# Patient Record
Sex: Male | Born: 1975 | Hispanic: No | Marital: Single | Smoking: Current every day smoker
Health system: Southern US, Community
[De-identification: ages and names within clinical notes are randomized; demographics above are authoritative.]

## PROBLEM LIST (undated history)

## (undated) DIAGNOSIS — D569 Thalassemia, unspecified: Secondary | ICD-10-CM

## (undated) DIAGNOSIS — D649 Anemia, unspecified: Secondary | ICD-10-CM

## (undated) HISTORY — PX: HERNIA REPAIR: SHX51

## (undated) HISTORY — DX: Anemia, unspecified: D64.9

## (undated) HISTORY — DX: Thalassemia, unspecified: D56.9

## (undated) HISTORY — PX: APPENDECTOMY: SHX54

---

## 2012-11-21 ENCOUNTER — Emergency Department (HOSPITAL_COMMUNITY)
Admission: EM | Admit: 2012-11-21 | Discharge: 2012-11-21 | Disposition: A | Discharge status: Home / Self Care | Payer: Managed Care, Other (non HMO) | Attending: Emergency Medicine | Admitting: Emergency Medicine

## 2012-11-21 ENCOUNTER — Encounter (HOSPITAL_COMMUNITY): Payer: Self-pay | Admitting: *Deleted

## 2012-11-21 DIAGNOSIS — M79604 Pain in right leg: Secondary | ICD-10-CM

## 2012-11-21 DIAGNOSIS — F17200 Nicotine dependence, unspecified, uncomplicated: Secondary | ICD-10-CM | POA: Insufficient documentation

## 2012-11-21 DIAGNOSIS — M79609 Pain in unspecified limb: Secondary | ICD-10-CM | POA: Insufficient documentation

## 2012-11-21 LAB — CBC WITH DIFFERENTIAL/PLATELET
Basophils Relative: 0 % (ref 0–1)
Eosinophils Absolute: 0.3 10*3/uL (ref 0.0–0.7)
Eosinophils Relative: 2 % (ref 0–5)
MCH: 19.5 pg — ABNORMAL LOW (ref 26.0–34.0)
MCHC: 33 g/dL (ref 30.0–36.0)
MCV: 59 fL — ABNORMAL LOW (ref 78.0–100.0)
Monocytes Relative: 7 % (ref 3–12)
Neutrophils Relative %: 58 % (ref 43–77)
Platelets: 222 10*3/uL (ref 150–400)

## 2012-11-21 LAB — COMPREHENSIVE METABOLIC PANEL WITH GFR
ALT: 20 U/L (ref 0–53)
AST: 17 U/L (ref 0–37)
Albumin: 4.4 g/dL (ref 3.5–5.2)
Alkaline Phosphatase: 100 U/L (ref 39–117)
BUN: 13 mg/dL (ref 6–23)
CO2: 29 meq/L (ref 19–32)
Calcium: 9.9 mg/dL (ref 8.4–10.5)
Chloride: 101 meq/L (ref 96–112)
Creatinine, Ser: 0.83 mg/dL (ref 0.50–1.35)
GFR calc Af Amer: >90 mL/min
GFR calc non Af Amer: >90 mL/min
Glucose, Bld: 105 mg/dL — ABNORMAL HIGH (ref 70–99)
Potassium: 4 meq/L (ref 3.5–5.1)
Sodium: 140 meq/L (ref 135–145)
Total Bilirubin: 0.9 mg/dL (ref 0.3–1.2)
Total Protein: 7.8 g/dL (ref 6.0–8.3)

## 2012-11-21 LAB — CK: Total CK: 93 U/L (ref 7–232)

## 2012-11-21 NOTE — ED Provider Notes (Signed)
History     CSN: [id number]  Arrival date & time 11/21/12  2037   First MD Initiated Contact with Patient 11/21/12 2148      Chief Complaint  Patient presents with  . Leg Pain    bilateral    (Consider location/radiation/quality/duration/timing/severity/associated sxs/prior treatment) HPI Comments: The patient presents here with bilateral lower extremity pain that started while driving here from [facility].  He is now having the same sensation in his arms.  He denies any chest pain or shortness of breath.    Patient is a 36 y.o. male presenting with leg pain. The history is provided by the patient.  Leg Pain  The incident occurred 2 days ago. The incident occurred at home. There was no injury mechanism.    History reviewed. No pertinent past medical history.  Past Surgical History  Procedure Date  . Appendectomy   . Hernia repair     bilateral inguinal    History reviewed. No pertinent family history.  History  Substance Use Topics  . Smoking status: Current Every Day Smoker  . Smokeless tobacco: Not on file  . Alcohol Use: Yes      Review of Systems  All other systems reviewed and are negative.    Allergies  Review of patient's allergies indicates no known allergies.  Home Medications   Current Outpatient Rx  Name  Route  Sig  Dispense  Refill  . OVER THE COUNTER MEDICATION   Oral   Take 2 tablets by mouth daily as needed. Panadol extra.  Pain and fever reducer.           BP 164/88  Pulse 82  Temp 97.9 F (36.6 C)  Resp 16  SpO2 100%  Physical Exam  Nursing note and vitals reviewed. Constitutional: He is oriented to person, place, and time. He appears well-developed and well-nourished. No distress.  HENT:  Head: Normocephalic and atraumatic.  Mouth/Throat: Oropharynx is clear and moist.  Neck: Normal range of motion. Neck supple.  Cardiovascular: Normal rate and regular rhythm.   No murmur heard. Pulmonary/Chest: Effort normal and breath  sounds normal. No respiratory distress.  Abdominal: Soft. Bowel sounds are normal.  Musculoskeletal: Normal range of motion. He exhibits no edema.       There is no calf tenderness or swelling.  The distal pulses are easily palpable.  Homan sign is absent bilaterally.  There are no palpable cords.    Neurological: He is alert and oriented to person, place, and time.  Skin: Skin is warm and dry. He is not diaphoretic.    ED Course  Procedures (including critical care time)  Labs Reviewed  CBC WITH DIFFERENTIAL - Abnormal; Notable for the following:    WBC 13.2 (*)     RBC 6.22 (*)     Hemoglobin 12.1 (*)     HCT 36.7 (*)     MCV 59.0 (*)     MCH 19.5 (*)     Lymphs Abs 4.3 (*)     All other components within normal limits  COMPREHENSIVE METABOLIC PANEL - Abnormal; Notable for the following:    Glucose, Bld 105 (*)     All other components within normal limits  CK   No results found.   No diagnosis found.    MDM  Very low suspicion for dvt as the symptoms are bilateral, Homan is absent, and the symptoms are in his arms as well.  Other consideration is rhabdo, however ck is normal.  At this point, he will be discharged to home, to return prn.        Geoffery Lyons, MD 11/21/12 2245

## 2012-11-21 NOTE — ED Notes (Signed)
Pt state that it took 6 days but he drove here from [facility]. Pt states that three days ago he started having pain and numbness in both of his legs. Pt states that the pain is now in his knees and the numbness is also in his hands. Pt ambulatory

## 2013-01-12 ENCOUNTER — Encounter (HOSPITAL_COMMUNITY): Payer: Self-pay | Admitting: Emergency Medicine

## 2013-01-12 ENCOUNTER — Emergency Department (HOSPITAL_COMMUNITY)
Admission: EM | Admit: 2013-01-12 | Discharge: 2013-01-12 | Disposition: A | Discharge status: Home / Self Care | Payer: Managed Care, Other (non HMO) | Attending: Emergency Medicine | Admitting: Emergency Medicine

## 2013-01-12 DIAGNOSIS — Z862 Personal history of diseases of the blood and blood-forming organs and certain disorders involving the immune mechanism: Secondary | ICD-10-CM | POA: Insufficient documentation

## 2013-01-12 DIAGNOSIS — M545 Low back pain, unspecified: Secondary | ICD-10-CM | POA: Insufficient documentation

## 2013-01-12 DIAGNOSIS — R5383 Other fatigue: Secondary | ICD-10-CM | POA: Insufficient documentation

## 2013-01-12 DIAGNOSIS — M79609 Pain in unspecified limb: Secondary | ICD-10-CM | POA: Insufficient documentation

## 2013-01-12 DIAGNOSIS — R5381 Other malaise: Secondary | ICD-10-CM | POA: Insufficient documentation

## 2013-01-12 DIAGNOSIS — F17200 Nicotine dependence, unspecified, uncomplicated: Secondary | ICD-10-CM | POA: Insufficient documentation

## 2013-01-12 DIAGNOSIS — R61 Generalized hyperhidrosis: Secondary | ICD-10-CM | POA: Insufficient documentation

## 2013-01-12 DIAGNOSIS — R509 Fever, unspecified: Secondary | ICD-10-CM | POA: Insufficient documentation

## 2013-01-12 LAB — URINE MICROSCOPIC-ADD ON

## 2013-01-12 LAB — URINALYSIS, ROUTINE W REFLEX MICROSCOPIC
Bilirubin Urine: NEGATIVE
Nitrite: POSITIVE — AB
Specific Gravity, Urine: 1.013 (ref 1.005–1.030)
Urobilinogen, UA: 1 mg/dL (ref 0.0–1.0)
pH: 7 (ref 5.0–8.0)

## 2013-01-12 MED ORDER — IBUPROFEN 600 MG PO TABS: 600.0000 mg | ORAL_TABLET | Freq: Four times a day (QID) | ORAL | Status: DC | PRN

## 2013-01-12 MED ORDER — METHOCARBAMOL 500 MG PO TABS: 1000.0000 mg | ORAL_TABLET | Freq: Four times a day (QID) | ORAL | Status: DC

## 2013-01-12 MED ORDER — TRAMADOL HCL 50 MG PO TABS: 50.0000 mg | ORAL_TABLET | Freq: Four times a day (QID) | ORAL | Status: DC | PRN

## 2013-01-12 NOTE — ED Provider Notes (Signed)
History    This chart was scribed for non-physician practitioner working with [name], MD by Frederik Pear, ED Scribe. This patient was seen in room TR05C/TR05C and the patient's care was started at 1503.   CSN: [id number]  Arrival date & time 01/12/13  1438   First MD Initiated Contact with Patient 01/12/13 1503      Chief Complaint  Patient presents with  . Back Pain  . Leg Pain    (Consider location/radiation/quality/duration/timing/severity/associated sxs/prior treatment) Patient is a 37 y.o. male presenting with back pain and leg pain. The history is provided by the patient. No language interpreter was used.  Back Pain Associated symptoms: fever, leg pain and weakness   Associated symptoms: no dysuria   Leg Pain Associated symptoms: back pain and fever     Jonathan Bautista is a 37 y.o. male who presents to the Emergency Department complaining of gradual onset, constant, gradually worsening lower left-sided back pain that radiates as tingling and burning pain down his buttocks to his knees that is aggravated by walking with associated nocturnal hidrosis and generalized weakness that began 3 days ago. He states that the pain kept him awake during the first night as well as a subjective fever and polyuria that began on the first day, but have all since resolved. He denies any trauma or injury to the area, dysuria, or urinary or bowel incontinence. He reports a h/o of similar pain after he drove cross country from [place], [place], but states that the current pain is much stronger. He reports that he has been treating the pain with ibuprofen, which has provided relief, and OTC azo tablets, which were recommended by the pharmacist at [facility].  He reports a h/o of chronic anemia, but no h/o of previous back surgeries. He denies any use of IV drugs.      History reviewed. No pertinent past medical history.  Past Surgical History  Procedure Laterality Date  . Appendectomy    . Hernia  repair      bilateral inguinal    No family history on file.  History  Substance Use Topics  . Smoking status: Current Every Day Smoker  . Smokeless tobacco: Not on file  . Alcohol Use: No      Review of Systems  Constitutional: Positive for fever.  Endocrine: Positive for polyuria.  Genitourinary: Negative for dysuria.  Musculoskeletal: Positive for back pain.  Neurological: Positive for weakness.  All other systems reviewed and are negative.    Allergies  Review of patient's allergies indicates no known allergies.  Home Medications   Current Outpatient Rx  Name  Route  Sig  Dispense  Refill  . OVER THE COUNTER MEDICATION   Oral   Take 2 tablets by mouth daily as needed. Panadol extra.  Pain and fever reducer.           BP 136/86  Pulse 80  Temp(Src) 98.2 F (36.8 C) (Oral)  SpO2 100%  Physical Exam  Nursing note and vitals reviewed. Constitutional: He is oriented to person, place, and time. He appears well-developed and well-nourished.  HENT:  Head: Normocephalic and atraumatic.  Eyes: Conjunctivae are normal.  Neck: Normal range of motion. Neck supple.  Pulmonary/Chest: Effort normal. No respiratory distress.  Abdominal: Soft. There is no tenderness. There is no CVA tenderness.  Musculoskeletal: Normal range of motion. He exhibits tenderness.       Lumbar back: He exhibits tenderness and spasm.       Back:  Tender over the paraspinal muscles in the lumbar spine. Normal sensation and reflexes in all extremites Normal gait.   Neurological: He is alert and oriented to person, place, and time. He has normal strength and normal reflexes. No sensory deficit. He exhibits normal muscle tone. Coordination and gait normal.  5/5 strength in all extremities. 2+ patellar tendon DTRs bilaterally.   Skin: Skin is warm and dry. No erythema.  Psychiatric: He has a normal mood and affect. Thought content normal.    ED Course  Procedures (including critical care  time)  DIAGNOSTIC STUDIES: Oxygen Saturation is 100% on room air, normal by my interpretation.    COORDINATION OF CARE:  15:19- Discussed planned course of treatment with the patient, including a UA, who is agreeable at this time.  Results for orders placed during the hospital encounter of 01/12/13  URINALYSIS, ROUTINE W REFLEX MICROSCOPIC      Result Value Range   Color, Urine AMBER (*) YELLOW   APPearance CLOUDY (*) CLEAR   Specific Gravity, Urine 1.013  1.005 - 1.030   pH 7.0  5.0 - 8.0   Glucose, UA NEGATIVE  NEGATIVE mg/dL   Hgb urine dipstick TRACE (*) NEGATIVE   Bilirubin Urine NEGATIVE  NEGATIVE   Ketones, ur NEGATIVE  NEGATIVE mg/dL   Protein, ur NEGATIVE  NEGATIVE mg/dL   Urobilinogen, UA 1.0  0.0 - 1.0 mg/dL   Nitrite POSITIVE (*) NEGATIVE   Leukocytes, UA TRACE (*) NEGATIVE  URINE MICROSCOPIC-ADD ON      Result Value Range   Squamous Epithelial / LPF RARE  RARE   WBC, UA 0-2  <3 WBC/hpf   RBC / HPF 3-6  <3 RBC/hpf   Bacteria, UA RARE  RARE      Labs Reviewed  URINALYSIS, ROUTINE W REFLEX MICROSCOPIC - Abnormal; Notable for the following:    Color, Urine AMBER (*)    APPearance CLOUDY (*)    Hgb urine dipstick TRACE (*)    Nitrite POSITIVE (*)    Leukocytes, UA TRACE (*)    All other components within normal limits  URINE MICROSCOPIC-ADD ON   No results found.   1. Lower back pain       3:37 PM Patient seen and examined. Work-up initiated.    Vital signs reviewed and are as follows: Filed Vitals:   01/12/13 1453  BP: 136/86  Pulse: 80  Temp: 98.2 F (36.8 C)   UA shows positive nitrate, but 0-2 WBC and rare bacteria. Urine culture is sent.   No red flag s/s of low back pain. Patient was counseled on back pain precautions and told to do activity as tolerated but do not lift, push, or pull heavy objects more than 10 pounds for the next week.  Patient counseled to use ice or heat on back for no longer than 15 minutes every hour.   Patient  prescribed muscle relaxer and counseled on proper use of muscle relaxant medication.    Patient prescribed narcotic pain medicine and counseled on proper use of narcotic pain medications. Counseled not to combine this medication with others containing tylenol.   Urged patient not to drink alcohol, drive, or perform any other activities that requires focus while taking either of these medications.  Patient urged to follow-up with PCP if pain does not improve with treatment and rest or if pain becomes recurrent. Urged to return with worsening severe pain, loss of bowel or bladder control, trouble walking.   The patient verbalizes understanding and  agrees with the plan.    MDM  Patient with back pain with radiation into bilateral legs. No neurological deficits. Patient is ambulatory, normal gait. No warning symptoms of back pain including: loss of bowel or bladder control, saddle paresthesias, night sweats, waking from sleep with back pain, unexplained fevers or weight loss, h/o cancer, IVDU, recent trauma. No concern for cauda equina, epidural abscess, or other serious cause of back pain. Conservative measures such as rest, ice/heat and pain medicine indicated with PCP follow-up if no improvement with conservative management.    I personally performed the services described in this documentation, which was scribed in my presence. The recorded information has been reviewed and is accurate.        Renne Crigler, [place] 01/12/13 [phone]

## 2013-01-12 NOTE — ED Notes (Signed)
Onset 3 days ago pain left flank and frequent urination 3 times at night and developed bilateral lower extremity pain tingling numbness. States took motrin with relief.  Concerned pain not resolving.  Pain currently 10/10.

## 2013-01-15 NOTE — ED Provider Notes (Signed)
Medical screening examination/treatment/procedure(s) were performed by non-physician practitioner and as supervising physician I was immediately available for consultation/collaboration.   [name], MD 01/15/13 [phone]

## 2013-01-25 ENCOUNTER — Ambulatory Visit (INDEPENDENT_AMBULATORY_CARE_PROVIDER_SITE_OTHER): Payer: Managed Care, Other (non HMO) | Admitting: Family Medicine

## 2013-01-25 ENCOUNTER — Ambulatory Visit: Payer: Managed Care, Other (non HMO)

## 2013-01-25 VITALS — BP 164/97 | HR 90 | Temp 97.8°F | Resp 18 | Ht 71.0 in | Wt 205.0 lb

## 2013-01-25 DIAGNOSIS — D649 Anemia, unspecified: Secondary | ICD-10-CM

## 2013-01-25 DIAGNOSIS — R202 Paresthesia of skin: Secondary | ICD-10-CM

## 2013-01-25 DIAGNOSIS — R531 Weakness: Secondary | ICD-10-CM

## 2013-01-25 DIAGNOSIS — M25562 Pain in left knee: Secondary | ICD-10-CM

## 2013-01-25 DIAGNOSIS — M25569 Pain in unspecified knee: Secondary | ICD-10-CM

## 2013-01-25 DIAGNOSIS — R5381 Other malaise: Secondary | ICD-10-CM

## 2013-01-25 DIAGNOSIS — N390 Urinary tract infection, site not specified: Secondary | ICD-10-CM

## 2013-01-25 DIAGNOSIS — Z131 Encounter for screening for diabetes mellitus: Secondary | ICD-10-CM

## 2013-01-25 DIAGNOSIS — M25561 Pain in right knee: Secondary | ICD-10-CM

## 2013-01-25 DIAGNOSIS — R55 Syncope and collapse: Secondary | ICD-10-CM

## 2013-01-25 LAB — POCT CBC
Granulocyte percent: 65.4 %G (ref 37–80)
HCT, POC: 38.4 % — AB (ref 43.5–53.7)
Hemoglobin: 12.1 g/dL — AB (ref 14.1–18.1)
Lymph, poc: 3.8 — AB (ref 0.6–3.4)
MCH, POC: 19.4 pg — AB (ref 27–31.2)
MCHC: 31.5 g/dL — AB (ref 31.8–35.4)
MCV: 61.5 fL — AB (ref 80–97)
MID (cbc): 0.6 (ref 0–0.9)
POC Granulocyte: 8.4 — AB (ref 2–6.9)
POC LYMPH PERCENT: 29.8 % (ref 10–50)
POC MID %: 4.8 %M (ref 0–12)
Platelet Count, POC: 300 10*3/uL (ref 142–424)
RBC: 6.25 M/uL — AB (ref 4.69–6.13)
RDW, POC: 16.6 %
WBC: 12.8 10*3/uL — AB (ref 4.6–10.2)

## 2013-01-25 LAB — POCT URINALYSIS DIPSTICK
Bilirubin, UA: NEGATIVE
Glucose, UA: NEGATIVE
Ketones, UA: NEGATIVE
Leukocytes, UA: NEGATIVE
Nitrite, UA: NEGATIVE
Protein, UA: NEGATIVE
Spec Grav, UA: 1.015
Urobilinogen, UA: 0.2
pH, UA: 7

## 2013-01-25 LAB — RPR

## 2013-01-25 LAB — POCT SEDIMENTATION RATE: POCT SED RATE: 8 mm/hr (ref 0–22)

## 2013-01-25 LAB — COMPREHENSIVE METABOLIC PANEL
ALT: 22 U/L (ref 0–53)
Albumin: 5 g/dL (ref 3.5–5.2)
CO2: 28 mEq/L (ref 19–32)
Glucose, Bld: 83 mg/dL (ref 70–99)
Potassium: 3.7 mEq/L (ref 3.5–5.3)
Sodium: 139 mEq/L (ref 135–145)
Total Bilirubin: 1.1 mg/dL (ref 0.3–1.2)
Total Protein: 8.1 g/dL (ref 6.0–8.3)

## 2013-01-25 LAB — POCT UA - MICROSCOPIC ONLY
Bacteria, U Microscopic: NEGATIVE
Casts, Ur, LPF, POC: NEGATIVE
Crystals, Ur, HPF, POC: NEGATIVE
Mucus, UA: NEGATIVE
WBC, Ur, HPF, POC: NEGATIVE
Yeast, UA: NEGATIVE

## 2013-01-25 LAB — COMPREHENSIVE METABOLIC PANEL WITH GFR
AST: 16 U/L (ref 0–37)
Alkaline Phosphatase: 106 U/L (ref 39–117)
BUN: 9 mg/dL (ref 6–23)
Calcium: 9.7 mg/dL (ref 8.4–10.5)
Chloride: 100 meq/L (ref 96–112)
Creat: 0.81 mg/dL (ref 0.50–1.35)

## 2013-01-25 LAB — URIC ACID: Uric Acid, Serum: 5.3 mg/dL (ref 4.0–7.8)

## 2013-01-25 LAB — POCT GLYCOSYLATED HEMOGLOBIN (HGB A1C): Hemoglobin A1C: 5

## 2013-01-25 MED ORDER — CIPROFLOXACIN HCL 250 MG PO TABS: 250.0000 mg | ORAL_TABLET | Freq: Two times a day (BID) | ORAL | Status: DC

## 2013-01-25 NOTE — Progress Notes (Signed)
[facility]:  Office Visit  Chief Complaint:  Chief Complaint  Patient presents with  . Generalized Body Aches    joints ache- began today- wants Uric Acid level checked  . Night Sweats  . Leg Pain    Bil leg numbness/prickly feeling- began about 5 days ago  . Abdominal Pain    LUQ pain  . burping  . Dizziness    HPI: Jonathan Bautista is a 37 y.o. male who complains of  Multiple issues:   5 day h/o Dizziness and weakness, has pain only when he sits or lays in his bed. Not when he walks., Radiates from his knees to his  During the weekend he got drunk and threw up the next day. He has been eating and drinking ok since but has loss if appetite.  Numbness and tingling all the way from bilateral knees to toes.  Started 1 month ago from [place]. Went to [facility] ER and got evaluated.  Was seen by rheumatologist and did not get any tests done Was seen in ER with similar sxs again on 01/12/2013 but was told he needed to get a PCP Goes to bed at 12 or 11 , sleeps 9-10 hours. Feels that is too much sleep for him  No recent travels outside of country except for when he came here 1 year ago from [place], went to [facility] then came here 1 month ago  Took Ibuprofen Has not had the flu vaccine No prior knee injuries No prior STDs, recently tested in [facility] and was neg.  Denies weight loss/night sweats, fevers, chills, LAD  Past Medical History  Diagnosis Date  . Anemia   . Thalassemia    Past Surgical History  Procedure Laterality Date  . Appendectomy    . Hernia repair      bilateral inguinal   History   Social History  . Marital Status: Single    Spouse Name: N/A    Number of Children: N/A  . Years of Education: N/A   Social History Main Topics  . Smoking status: Current Every Day Smoker    Types: Cigarettes  . Smokeless tobacco: None  . Alcohol Use: Yes     Comment: "sometimes"  . Drug Use: No  . Sexually Active: None   Other Topics Concern  .  None   Social History Narrative  . None   Family History  Problem Relation Age of Onset  . Diabetes Mother   . Hyperlipidemia Mother   . Hypertension Mother   . Hypertension Father    No Known Allergies Prior to Admission medications   Medication Sig Start Date End Date Taking? Authorizing Provider  ibuprofen (ADVIL,MOTRIN) 600 MG tablet Take 1 tablet (600 mg total) by mouth every 6 (six) hours as needed for pain. 01/12/13   Renne Crigler, PA  methocarbamol (ROBAXIN) 500 MG tablet Take 2 tablets (1,000 mg total) by mouth 4 (four) times daily. 01/12/13   Renne Crigler, PA  phenazopyridine (PYRIDIUM) 100 MG tablet Take 100-200 mg by mouth 2 (two) times daily as needed for pain.    Historical Provider, MD  traMADol (ULTRAM) 50 MG tablet Take 1 tablet (50 mg total) by mouth every 6 (six) hours as needed for pain. 01/12/13   Renne Crigler, PA     ROS: The patient denies fevers, chills, night sweats, unintentional weight loss, chest pain, palpitations, wheezing, dyspnea on exertion, nausea, vomiting, abdominal pain, dysuria, hematuria, melena.  All other  systems have been reviewed and were otherwise negative with the exception of those mentioned in the HPI and as above.    PHYSICAL EXAM: Filed Vitals:   01/25/13 1423  BP: 164/97  Pulse: 90  Temp: 97.8 F (36.6 C)  Resp: 18   Filed Vitals:   01/25/13 1423  Height: 5\' 11"  (1.803 m)  Weight: 205 lb (92.987 kg)   Body mass index is 28.6 kg/(m^2).  General: Alert, no acute distress HEENT:  Normocephalic, atraumatic, oropharynx patent. EOMI, PERRLA, fundoscopic exam nl Cardiovascular:  Regular rate and rhythm, no rubs murmurs or gallops.  No Carotid bruits, radial pulse intact. No pedal edema.  Respiratory: Clear to auscultation bilaterally.  No wheezes, rales, or rhonchi.  No cyanosis, no use of accessory musculature GI: No organomegaly, abdomen is soft and non-tender, positive bowel sounds.  No masses. Skin: No rashes. Neurologic:  Facial musculature symmetric. Psychiatric: Patient is appropriate throughout our interaction. Lymphatic: No cervical lymphadenopathy Musculoskeletal: Gait intact. Knee bilateral-nl exam Legs-no assymm, nontender, neg Homans, microfilament exam nl  LABS: Results for orders placed in visit on 01/25/13  COMPREHENSIVE METABOLIC PANEL      Result Value Range   Sodium 139  135 - 145 mEq/L   Potassium 3.7  3.5 - 5.3 mEq/L   Chloride 100  96 - 112 mEq/L   CO2 28  19 - 32 mEq/L   Glucose, Bld 83  70 - 99 mg/dL   BUN 9  6 - 23 mg/dL   Creat 0.98  1.19 - 1.47 mg/dL   Total Bilirubin 1.1  0.3 - 1.2 mg/dL   Alkaline Phosphatase 106  39 - 117 U/L   AST 16  0 - 37 U/L   ALT 22  0 - 53 U/L   Total Protein 8.1  6.0 - 8.3 g/dL   Albumin 5.0  3.5 - 5.2 g/dL   Calcium 9.7  8.4 - 82.9 mg/dL  URIC ACID      Result Value Range   Uric Acid, Serum 5.3  4.0 - 7.8 mg/dL  RPR      Result Value Range   RPR NON REAC  NON REAC  POCT CBC      Result Value Range   WBC 12.8 (*) 4.6 - 10.2 K/uL   Lymph, poc 3.8 (*) 0.6 - 3.4   POC LYMPH PERCENT 29.8  10 - 50 %L   MID (cbc) 0.6  0 - 0.9   POC MID % 4.8  0 - 12 %M   POC Granulocyte 8.4 (*) 2 - 6.9   Granulocyte percent 65.4  37 - 80 %G   RBC 6.25 (*) 4.69 - 6.13 M/uL   Hemoglobin 12.1 (*) 14.1 - 18.1 g/dL   HCT, POC 56.2 (*) 13.0 - 53.7 %   MCV 61.5 (*) 80 - 97 fL   MCH, POC 19.4 (*) 27 - 31.2 pg   MCHC 31.5 (*) 31.8 - 35.4 g/dL   RDW, POC 86.5     Platelet Count, POC 300  142 - 424 K/uL   MPV    0 - 99.8 fL  POCT SEDIMENTATION RATE      Result Value Range   POCT SED RATE 8  0 - 22 mm/hr  POCT GLYCOSYLATED HEMOGLOBIN (HGB A1C)      Result Value Range   Hemoglobin A1C 5.0    POCT UA - MICROSCOPIC ONLY      Result Value Range   WBC, Ur, HPF, POC neg  RBC, urine, microscopic 4-6     Bacteria, U Microscopic neg     Mucus, UA neg     Epithelial cells, urine per micros 0-2     Crystals, Ur, HPF, POC neg     Casts, Ur, LPF, POC neg     Yeast,  UA neg    POCT URINALYSIS DIPSTICK      Result Value Range   Color, UA yellow     Clarity, UA clear     Glucose, UA neg     Bilirubin, UA neg     Ketones, UA neg     Spec Grav, UA 1.015     Blood, UA moderate     pH, UA 7.0     Protein, UA neg     Urobilinogen, UA 0.2     Nitrite, UA neg     Leukocytes, UA Negative       EKG/XRAY:   Primary read interpreted by Dr. Conley Rolls at [facility]. No fractures or dislocation  Bilateral knee No gouty changes, no DJD    ASSESSMENT/PLAN: Encounter Diagnoses  Name Primary?  . Knee pain, bilateral Yes  . Weakness generalized   . Screening for diabetes mellitus   . Anemia   . Paresthesia   . UTI (urinary tract infection)    37 y/o [name] male, who came to the [place]  1year ago. Was living in [place] but 1 month ago came to the [place] to study [place] at [facility]. He is very adament about dictating his care.  Patient presents with multiple medical problems as above. I will get labs for numbness/tingling of bilateral legs from knee down. Does not appear to be high risk for DVT. Only risk factor was long car ride from [facility].  I reviewed his chart from his 2 ER visits. There was a UA that was +, will treat and get urine cx.  Rx Cipro 250 mg BID Patient was given urine cup to collect specimen Labs pending for RPR, ESR, uric acid and Hgb electrophoresis.  F/u prn   If he returns and we are able to get a clean and dirty UA for GC then please collect it and  order a uriprobe for me. He stated to the assistant after the fact that he had some white lesions on his penis, nonpainful after I had left the room but was too embarrassed to tell me.  He denied having any STDs to me when I asked, he stated he had his STD tests done in [facility] and all was negative.     Yuleidy Rappleye PHUONG, DO 01/26/2013 11:45 AM

## 2013-01-26 ENCOUNTER — Telehealth: Payer: Self-pay | Admitting: *Deleted

## 2013-01-27 LAB — URINE CULTURE
Colony Count: NO GROWTH
Organism ID, Bacteria: NO GROWTH

## 2013-01-29 ENCOUNTER — Other Ambulatory Visit (INDEPENDENT_AMBULATORY_CARE_PROVIDER_SITE_OTHER): Payer: Managed Care, Other (non HMO) | Admitting: Family Medicine

## 2013-01-29 ENCOUNTER — Telehealth: Payer: Self-pay | Admitting: Family Medicine

## 2013-01-29 DIAGNOSIS — Z113 Encounter for screening for infections with a predominantly sexual mode of transmission: Secondary | ICD-10-CM

## 2013-01-29 LAB — HEMOGLOBINOPATHY EVALUATION
Hemoglobin Other: 0 %
Hgb A2 Quant: 5.2 % — ABNORMAL HIGH (ref 2.2–3.2)
Hgb A: 94.8 % — ABNORMAL LOW (ref 96.8–97.8)
Hgb F Quant: 0 % (ref 0.0–2.0)
Hgb S Quant: 0 %

## 2013-01-29 NOTE — Progress Notes (Unsigned)
Here today for a URI Probe only

## 2013-01-29 NOTE — Telephone Encounter (Signed)
Spoke to patient about labs. Still waiting for Hemoglobin electrophoresis. He will return to give [place] a clean and dirty catch for G/C uriprobe since he did not do it last time. No OV needed

## 2013-01-30 LAB — GC/CHLAMYDIA PROBE AMP, URINE
Chlamydia, Swab/Urine, PCR: NEGATIVE
GC Probe Amp, Urine: NEGATIVE

## 2013-02-01 ENCOUNTER — Encounter: Payer: Self-pay | Admitting: Family Medicine

## 2013-02-06 ENCOUNTER — Ambulatory Visit: Payer: Managed Care, Other (non HMO)

## 2013-02-06 ENCOUNTER — Ambulatory Visit (INDEPENDENT_AMBULATORY_CARE_PROVIDER_SITE_OTHER): Payer: Managed Care, Other (non HMO) | Admitting: Family Medicine

## 2013-02-06 VITALS — BP 140/86 | HR 100 | Temp 98.0°F | Resp 20 | Ht 71.0 in | Wt 205.0 lb

## 2013-02-06 DIAGNOSIS — R5381 Other malaise: Secondary | ICD-10-CM

## 2013-02-06 DIAGNOSIS — R202 Paresthesia of skin: Secondary | ICD-10-CM

## 2013-02-06 DIAGNOSIS — M549 Dorsalgia, unspecified: Secondary | ICD-10-CM

## 2013-02-06 DIAGNOSIS — D649 Anemia, unspecified: Secondary | ICD-10-CM

## 2013-02-06 DIAGNOSIS — R5383 Other fatigue: Secondary | ICD-10-CM

## 2013-02-06 DIAGNOSIS — M79601 Pain in right arm: Secondary | ICD-10-CM

## 2013-02-06 DIAGNOSIS — M79602 Pain in left arm: Secondary | ICD-10-CM

## 2013-02-06 DIAGNOSIS — R209 Unspecified disturbances of skin sensation: Secondary | ICD-10-CM

## 2013-02-06 NOTE — Progress Notes (Signed)
[facility]:  Office Visit  Chief Complaint: Recheck, back pain.   HPI: Jonathan Bautista is a  37 y.o. male who complains of:  1. Paresthesia of UE and LE since trip from [place]. Starts from his knee down. No calf swelling. Numbness and tingling all the way from bilateral knees to toes. Prior screening for myopathy, diabetes, gout and clinical assessment for DVT were all negative.  Started 1 month ago from [place]. Went to [facility] ER and got evaluated. Was seen by rheumatologist and did not get any tests done, from what I can tell he was given a prednisone pack and he was ok and without sxs for 1 month.  Was seen in ER with similar sxs again on 01/12/2013 but was told he needed to get a PCP. Goes to bed at 12 or 11 , sleeps 9-10 hours. Feels that is too much sleep for him. He has a h/o thalassemia trait. He states his legs get crampy and he has to move them at night, he has numbness and tingling. He does not think exercise or rest makes it better or worse. On my last visit with him we did all sorts of labs, everything was normal except for CBC due to his thalassemia. Recent normal labs from our last OV and ER visits include: CMP, CK, ESR, Gonorrhea/Chlamydia, RPR, Uric Acid, HBA1c.  He was also given a 3 day course of cipro on the last visit and was feeling better , this was to treat a UA that was positive in the ER. The UA that did come back from our office after he left was negative, urine cx was negative.   2. Today he complains also of intermittent back pain, no treatment, NKI. Does not exercise. No prior injuries or surgeries.    Past Medical History  Diagnosis Date  . Anemia   . Thalassemia    Past Surgical History  Procedure Laterality Date  . Appendectomy    . Hernia repair      bilateral inguinal   History   Social History  . Marital Status: Single    Spouse Name: N/A    Number of Children: N/A  . Years of Education: N/A   Social History Main Topics  .  Smoking status: Current Every Day Smoker    Types: Cigarettes  . Smokeless tobacco: None  . Alcohol Use: Yes     Comment: "sometimes"  . Drug Use: No  . Sexually Active: None   Other Topics Concern  . None   Social History Narrative  . None   Family History  Problem Relation Age of Onset  . Diabetes Mother   . Hyperlipidemia Mother   . Hypertension Mother   . Hypertension Father    No Known Allergies Prior to Admission medications   Medication Sig Start Date End Date Taking? Authorizing Provider  ciprofloxacin (CIPRO) 250 MG tablet Take 1 tablet (250 mg total) by mouth 2 (two) times daily. 01/25/13  Yes Thao P Le, DO  ibuprofen (ADVIL,MOTRIN) 600 MG tablet Take 1 tablet (600 mg total) by mouth every 6 (six) hours as needed for pain. 01/12/13  Yes Renne Crigler, PA-C  methocarbamol (ROBAXIN) 500 MG tablet Take 2 tablets (1,000 mg total) by mouth 4 (four) times daily. 01/12/13   Renne Crigler, PA-C  phenazopyridine (PYRIDIUM) 100 MG tablet Take 100-200 mg by mouth 2 (two) times daily as needed for pain.    Historical Provider, MD  traMADol Janean Sark) 50  MG tablet Take 1 tablet (50 mg total) by mouth every 6 (six) hours as needed for pain. 01/12/13   Renne Crigler, PA-C     ROS: The patient denies fevers, chills, night sweats, unintentional weight loss, chest pain, palpitations, wheezing, dyspnea on exertion, nausea, vomiting, abdominal pain, dysuria, hematuria, melena,  All other systems have been reviewed and were otherwise negative with the exception of those mentioned in the HPI and as above.    PHYSICAL EXAM: Filed Vitals:   02/06/13 1442  BP: 140/86  Pulse: 100  Temp: 98 F (36.7 C)  Resp: 20   Filed Vitals:   02/06/13 1442  Height: 5\' 11"  (1.803 m)  Weight: 205 lb (92.987 kg)   Body mass index is 28.6 kg/(m^2).  General: Alert, no acute distress HEENT:  Normocephalic, atraumatic, oropharynx patent.  Cardiovascular:  Regular rate and rhythm, no rubs murmurs or  gallops.  No Carotid bruits, radial pulse intact. No pedal edema.  Respiratory: Clear to auscultation bilaterally.  No wheezes, rales, or rhonchi.  No cyanosis, no use of accessory musculature GI: No organomegaly, abdomen is soft and non-tender, positive bowel sounds.  No masses. Skin: No rashes. Neurologic: Facial musculature symmetric. Psychiatric: Patient is appropriate throughout our interaction. Lymphatic: No cervical lymphadenopathy Musculoskeletal: Gait intact. T spine-no deformities, nontender, 5/5 strength, sensation intact, 2/2 DTRs, full AROM/PROM L-spine-no deformities, nontender, 5/5 strength, no saddle anesthesisa, 2/2 DTRs, full AROM/PROM, neg straight leg ( last visit I did microfilament exam and it was normal) Bilateral Knees-no deformities, nontender, 5/5 strength, 2/2 DTRs, full AROM/PROM, neg straight leg, no swelling, (neg lachman/neg MacMurray on last exam) Sensation intact  LABS: Results for orders placed in visit on 01/29/13  GC/CHLAMYDIA PROBE AMP, URINE      Result Value Range   Chlamydia, Swab/Urine, PCR NEGATIVE  NEGATIVE   GC Probe Amp, Urine NEGATIVE  NEGATIVE     EKG/XRAY:   Primary read interpreted by Dr. Conley Rolls at [facility]. L-spine-normal   ASSESSMENT/PLAN: Encounter Diagnoses  Name Primary?  . Paresthesia of both legs Yes  . Paresthesia and pain of both upper extremities   . Back pain   . Other malaise and fatigue   . Anemia    37 y/o [name] male, who came to the [place] 1 year ago. Was living in [place] but 1 month ago came [place] to study [place] at [employer]. He was and  is very adament about dictating his care.  Patient presents with same complaints of  numbness/tingling of bilateral shoulders,  legs from knee down. Does not appear to be high risk for DVT. Only risk factor was long car ride from [facility]. He denies SOB. He is here by himself, no family or friends in the area. I have not broached the ide that he may be  depressed or have an anxiety issue since that is currently culturally sensitive and he has only seen me for the 2nd time. He is slightly fixated on the idea that there is something wrong with him and that he needs to get his Hgb levels up so that he can feel better. We talked about what it means to have thalassemia but I am not sure if he completely understands.   Iron studies pending, if normal then will do a trial of mobic Consider PT Refer to rheumatology?  F/u prn    LE, THAO PHUONG, DO 02/06/2013 4:34 PM

## 2013-02-07 ENCOUNTER — Telehealth: Payer: Self-pay

## 2013-02-07 ENCOUNTER — Other Ambulatory Visit: Payer: Self-pay | Admitting: Family Medicine

## 2013-02-07 DIAGNOSIS — M25569 Pain in unspecified knee: Secondary | ICD-10-CM

## 2013-02-07 LAB — IBC PANEL
%SAT: 32 % (ref 20–55)
TIBC: 338 ug/dL (ref 215–435)
UIBC: 229 ug/dL (ref 125–400)

## 2013-02-07 LAB — IRON: Iron: 109 ug/dL (ref 42–165)

## 2013-02-07 LAB — FERRITIN: Ferritin: 128 ng/mL (ref 22–322)

## 2013-02-07 LAB — MAGNESIUM: Magnesium: 2 mg/dL (ref 1.5–2.5)

## 2013-02-07 LAB — VITAMIN B12: Vitamin B-12: 497 pg/mL (ref 211–911)

## 2013-02-07 MED ORDER — MELOXICAM 7.5 MG PO TABS: ORAL_TABLET | ORAL | Status: DC

## 2013-02-07 NOTE — Telephone Encounter (Signed)
Message copied by Johnnette Litter on Wed Feb 07, 2013  1:16 PM ------      Message from: Hamilton Capri P      Created: Wed Feb 07, 2013 12:41 PM       Please let him know that his iron, magnesium, vitamin B levels were all normal. I will go ahead and do a trial of mobic for him which he should take daily with food nad no other NSAIDs for 2 weeks. Then he either needs to make an appointment with Drs Neva Seat, Clelia Croft or [facility] for follow-up by appointment since he does not like to wait in the urgent care side or I can refer him to a rheumatologist if he is not improved. Advise to continue with back exercises.             I have sent his rx to the pharmacy listed.            Thanks,      Tle ------

## 2013-02-07 NOTE — Telephone Encounter (Signed)
VM box not set up

## 2013-02-09 NOTE — Telephone Encounter (Signed)
Unable to reach letter sent to patient

## 2013-02-09 NOTE — Telephone Encounter (Signed)
Called again, no answer

## 2013-02-13 ENCOUNTER — Ambulatory Visit (INDEPENDENT_AMBULATORY_CARE_PROVIDER_SITE_OTHER): Payer: Managed Care, Other (non HMO) | Admitting: Family Medicine

## 2013-02-13 VITALS — BP 125/85 | HR 80 | Temp 98.4°F | Resp 18 | Wt 203.0 lb

## 2013-02-13 DIAGNOSIS — R202 Paresthesia of skin: Secondary | ICD-10-CM | POA: Insufficient documentation

## 2013-02-13 DIAGNOSIS — R209 Unspecified disturbances of skin sensation: Secondary | ICD-10-CM

## 2013-02-13 DIAGNOSIS — M25519 Pain in unspecified shoulder: Secondary | ICD-10-CM

## 2013-02-13 NOTE — Progress Notes (Signed)
[facility]:  Office Visit  Chief Complaint:  Chief Complaint  Patient presents with  . Follow-up    HPI: Jonathan Bautista is a 37 y.o. male who is here for re-evaluation of intermittent numbness and tingling in bilateral legs mostly at night and this has been ongoing since December. I have done a series of test and so far all has been negative except for CBC which was slightly elevated but trending down from ER visit and also thalassemia.His lumbar spine xray was normal.  In  last 2 nights he has been unable to sleep . When he sits or laying down he has more numbness/tingling, walking does not cause this. He had steroids and felt better bu has not really tried any more medicines since he wants to know what is going on. Denies flu vaccine. Denies rashes or insect bites.  Distribution of paresthesia can be from anterior legs to posterior or just be at bilateral knees. Usually bilateral   Past Medical History  Diagnosis Date  . Anemia   . Thalassemia    Past Surgical History  Procedure Laterality Date  . Appendectomy    . Hernia repair      bilateral inguinal   History   Social History  . Marital Status: Single    Spouse Name: N/A    Number of Children: N/A  . Years of Education: N/A   Social History Main Topics  . Smoking status: Current Every Day Smoker    Types: Cigarettes  . Smokeless tobacco: None  . Alcohol Use: Yes     Comment: "sometimes"  . Drug Use: No  . Sexually Active: None   Other Topics Concern  . None   Social History Narrative  . None   Family History  Problem Relation Age of Onset  . Diabetes Mother   . Hyperlipidemia Mother   . Hypertension Mother   . Hypertension Father    No Known Allergies Prior to Admission medications   Medication Sig Start Date End Date Taking? Authorizing Provider  traMADol (ULTRAM) 50 MG tablet Take 1 tablet (50 mg total) by mouth every 6 (six) hours as needed for pain. 01/12/13  Yes Renne Crigler, PA-C   meloxicam (MOBIC) 7.5 MG tablet Take 1 tab PO twice a day as needed. Always take with food. No other NSAIDs. Use for 2 weeks only. Once in the morning and once before bedtime as needed. 02/07/13   Maribell Demeo P Kayela Humphres, DO  methocarbamol (ROBAXIN) 500 MG tablet Take 2 tablets (1,000 mg total) by mouth 4 (four) times daily. 01/12/13   Renne Crigler, PA-C     ROS: The patient denies fevers, chills, night sweats, unintentional weight loss, chest pain, palpitations, wheezing, dyspnea on exertion, nausea, vomiting, abdominal pain, dysuria, hematuria, melena, + bilateral, numbness, weakness, or tingling lower extremities and bilateral shoulder pain.   All other systems have been reviewed and were otherwise negative with the exception of those mentioned in the HPI and as above.    PHYSICAL EXAM: Filed Vitals:   02/13/13 1507  BP: 125/85  Pulse: 80  Temp: 98.4 F (36.9 C)  Resp: 18   Filed Vitals:   02/13/13 1507  Weight: 203 lb (92.08 kg)   Body mass index is 28.33 kg/(m^2).  General: Alert, no acute distress HEENT:  Normocephalic, atraumatic, oropharynx patent.  Cardiovascular:  Regular rate and rhythm, no rubs murmurs or gallops.  No Carotid bruits, radial pulse intact. No pedal edema.  Respiratory: Clear to auscultation  bilaterally.  No wheezes, rales, or rhonchi.  No cyanosis, no use of accessory musculature GI: No organomegaly, abdomen is soft and non-tender, positive bowel sounds.  No masses. Skin: No rashes. Neurologic: Facial musculature symmetric. Psychiatric: Patient is appropriate throughout our interaction. Lymphatic: No cervical lymphadenopathy Musculoskeletal: Gait intact. Lumbar spine and Lower extremities-full ROM, 5/5 strength, 2/2 DTRs, sensaion intact, negative straight leg   LABS: Results for orders placed in visit on 02/06/13  FERRITIN      Result Value Range   Ferritin 128  22 - 322 ng/mL  IBC PANEL      Result Value Range   UIBC 229  125 - 400 ug/dL   TIBC 161  096 -  045 ug/dL   %SAT 32  20 - 55 %  IRON      Result Value Range   Iron 109  42 - 165 ug/dL  VITAMIN W09      Result Value Range   Vitamin B-12 497  211 - 911 pg/mL  MAGNESIUM      Result Value Range   Magnesium 2.0  1.5 - 2.5 mg/dL     EKG/XRAY:   Primary read interpreted by Dr. Conley Rolls at [facility].   ASSESSMENT/PLAN: Encounter Diagnoses  Name Primary?  . Paresthesia of bilateral legs Yes  . Shoulder pain, unspecified laterality    37 year old male who has seen me for 2 visits. He is here on his 3rd visit with similar sxs.  ? Etiology of intermittent paresthesia of lower extremity. Does not follow any specific nerve pattern/dermatome. He did have a long car ride across the country from [place] to here when this occurred.  Back xrays on last visit normal, labs ie Mg, Folate, Vit B12, CMP, ESR, RPR, uric acid normal. Denies insect/tick bites so did not do lyme/RSMF titers.No h/o family history RA. Declines more bloodwork today.Denies depression or anxiety. He will start taking NSAIDs for pain now, prior to that he did not want to take anything.  Will refer to neurology for evaluation and MRI prn.  Will refer to Rheumatology prn  F/u prn    Abrar Bilton PHUONG, DO 02/13/2013 3:29 PM

## 2013-02-26 ENCOUNTER — Ambulatory Visit: Payer: Managed Care, Other (non HMO) | Admitting: Neurology

## 2013-07-23 ENCOUNTER — Ambulatory Visit (INDEPENDENT_AMBULATORY_CARE_PROVIDER_SITE_OTHER): Payer: Managed Care, Other (non HMO) | Admitting: Physician Assistant

## 2013-07-23 VITALS — BP 134/84 | HR 73 | Temp 99.1°F | Resp 16 | Ht 72.0 in | Wt 203.8 lb

## 2013-07-23 DIAGNOSIS — Z111 Encounter for screening for respiratory tuberculosis: Secondary | ICD-10-CM

## 2013-07-23 NOTE — Progress Notes (Signed)
  Subjective:    Patient ID: Jonathan Bautista, male    DOB: [birth year], 37 y.o.   MRN: [id number]  HPI   Jonathan Bautista is a very pleasant 37 yr old male here requesting a TB quantiferon gold test.  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  Was told by [facility] that he needs this lab work completed.  He does not know if he has ever had a TB skin test.  He does not think he has ever had TB or been exposed to it.  He is unsure why school is requiring this test.  He is from [place].  He feels well today. Denies fever, chills, SOB, cough, weight loss.     Review of Systems  All other systems reviewed and are negative.       Objective:   Physical Exam  Vitals reviewed. Constitutional: He is oriented to person, place, and time. He appears well-developed and well-nourished. No distress.  HENT:  Head: Normocephalic and atraumatic.  Eyes: Conjunctivae are normal. No scleral icterus.  Cardiovascular: Normal rate, regular rhythm and normal heart sounds.   Pulmonary/Chest: Effort normal and breath sounds normal.  Neurological: He is alert and oriented to person, place, and time.  Skin: Skin is warm and dry.  Psychiatric: He has a normal mood and affect. His behavior is normal.        Assessment & Plan:  Screening for tuberculosis - Plan: Quantiferon tb gold assay   Jonathan Bautista is a pleasant 37 yr old male here requiring a TB quantiferon gold test for enrollment at [employer].  Labs drawn today.  Will make sure pt receives a copy of results to turn in to school.

## 2013-07-25 LAB — QUANTIFERON TB GOLD ASSAY (BLOOD)
Interferon Gamma Release Assay: NEGATIVE
TB Ag value: 0.02 IU/mL

## 2013-10-05 ENCOUNTER — Ambulatory Visit (INDEPENDENT_AMBULATORY_CARE_PROVIDER_SITE_OTHER): Payer: Managed Care, Other (non HMO) | Admitting: Physician Assistant

## 2013-10-05 VITALS — BP 128/70 | HR 85 | Temp 98.0°F | Resp 16 | Ht 72.0 in | Wt 203.0 lb

## 2013-10-05 DIAGNOSIS — R0981 Nasal congestion: Secondary | ICD-10-CM

## 2013-10-05 DIAGNOSIS — J3489 Other specified disorders of nose and nasal sinuses: Secondary | ICD-10-CM

## 2013-10-05 DIAGNOSIS — J029 Acute pharyngitis, unspecified: Secondary | ICD-10-CM

## 2013-10-05 MED ORDER — IPRATROPIUM BROMIDE 0.03 % NA SOLN: 2.0000 | Freq: Two times a day (BID) | NASAL | Status: DC

## 2013-10-05 NOTE — Progress Notes (Signed)
  Subjective:    Patient ID: Jonathan Bautista, male    DOB: September 16, 1976, 37 y.o.   MRN: [id number]  Cough Associated symptoms include chills and headaches. Pertinent negatives include no chest pain, ear pain, fever, shortness of breath or wheezing.  Headache  Associated symptoms include coughing. Pertinent negatives include no abdominal pain, ear pain, fever, hearing loss, nausea or vomiting.    Jonathan Bautista is a 37 YO male with a 15 pack year history presents today with a 3 day history of sore throat, congestion headache, "popping" ears, decreased appetite, and overall body weakness.  He states he stayed up late drinking alcohol the night before this current illness began which led to him vomiting.  He denies any nausea, vomiting, or abdominal pain since that night.  He states his sore throat causes enough pain that it is difficult to swallow at times. The "popping" sensation in his ears he describes similar to when he is on an airplane but denies any ringing in his ears, pain, or ear discharge. He denies any rhinorrhea or increase in coughing compared to his usual coughing he attributes to his smoking.  He denies any sick contacts. He has tried using nasal saline flushes, Tylenol QID, and fluids.    The patient is also interested in smoking and alcohol cessation but is unsure when he will do this.  He states he drinks 4-6 drinks/night some nights of the week which occurs typically when he is at home by himself. He smokes one pack/day for the past 15 years.   Past Medical History  Diagnosis Date  . Anemia   . Thalassemia      Review of Systems  Constitutional: Positive for chills, activity change and fatigue. Negative for fever.  HENT: Positive for congestion. Negative for ear discharge, ear pain and hearing loss.   Respiratory: Positive for cough. Negative for shortness of breath and wheezing.   Cardiovascular: Negative for chest pain.  Gastrointestinal: Negative for nausea, vomiting, abdominal  pain, diarrhea and abdominal distention.  Neurological: Positive for headaches.       Objective:   Physical Exam Jonathan Bautista  is a pleasant well-developed, well-nourished [place] male in no acute distress Heart: Normal rate and rhythm, no murmurs, rubs, or gallops Lungs: Clear to auscultation bilaterally, no wheezing, rhonchi, or rales EENT: External ear and ear canals clear with no erythema noted and TM visible bilaterally.  Nasal mucosa slightly erythematous and non-swollen.  Oral pharynx mildly erythematous with no tonsillar swelling or exudate.  Neck supple with cervical lymphadenopathy present bilaterally.  Frontal and maxillary sinuses tender to palpation.    BP 128/70  Pulse 85  Temp(Src) 98 F (36.7 C) (Oral)  Resp 16  Ht 6' (1.829 m)  Wt 203 lb (92.08 kg)  BMI 27.53 kg/m2  SpO2 98%      Assessment & Plan:  Sore throat-consistent with upper respiratory viral infection-Plan: Atrovent nasal spray, rest, fluids, and Mucinex OTC  Nasal congestion - Plan: ipratropium (ATROVENT) 0.03 % nasal spray  Patient Instructions  Please get plenty of rest and drink plenty of fluids, at least 64 oz/day Use Mucinex over the counter to thin out your mucous You can continue using the nasal saline flush as needed We also recommend to decrease your alcohol consumption to less than 2-3 alcoholic beverages/day If your symptoms do not resolve in the next 8-10 days, please return to the clinic

## 2013-10-05 NOTE — Patient Instructions (Addendum)
Please get plenty of rest and drink plenty of fluids, at least 64 oz/day Use Mucinex over the counter to thin out your mucous You can continue using the nasal saline flush as needed We also recommend to decrease your alcohol consumption to less than 2-3 alcoholic beverages/day If your symptoms do not resolve in the next 8-10 days, please return to the clinic

## 2014-01-23 ENCOUNTER — Ambulatory Visit (INDEPENDENT_AMBULATORY_CARE_PROVIDER_SITE_OTHER): Payer: Managed Care, Other (non HMO) | Admitting: Physician Assistant

## 2014-01-23 VITALS — BP 100/60 | HR 99 | Temp 98.1°F | Resp 16 | Ht 72.0 in | Wt 202.0 lb

## 2014-01-23 DIAGNOSIS — R05 Cough: Secondary | ICD-10-CM

## 2014-01-23 DIAGNOSIS — R059 Cough, unspecified: Secondary | ICD-10-CM

## 2014-01-23 DIAGNOSIS — J019 Acute sinusitis, unspecified: Secondary | ICD-10-CM

## 2014-01-23 LAB — POCT INFLUENZA A/B
INFLUENZA B, POC: NEGATIVE
Influenza A, POC: NEGATIVE

## 2014-01-23 MED ORDER — IPRATROPIUM BROMIDE 0.06 % NA SOLN: 2.0000 | Freq: Three times a day (TID) | NASAL | Status: DC

## 2014-01-23 MED ORDER — HYDROCOD POLST-CHLORPHEN POLST 10-8 MG/5ML PO LQCR: 5.0000 mL | Freq: Two times a day (BID) | ORAL | Status: DC | PRN

## 2014-01-23 MED ORDER — AMOXICILLIN-POT CLAVULANATE 875-125 MG PO TABS: 1.0000 | ORAL_TABLET | Freq: Two times a day (BID) | ORAL | Status: DC

## 2014-01-23 NOTE — Progress Notes (Signed)
Subjective:    Patient ID: Jonathan Bautista, male    DOB: 05/27/1976, 38 y.o.   MRN: [id number]  HPI 38 y.o. male presents with a 4 day history of nasal congestion, post nasal drip, sore throat, sinus pressure, cough, and a 2 day history of subjective fever and chills at nightitme. Nasal congestion thick and green/yellow. Sinus pressure is the worst symptom. Cough is not productive and worse at nighttime. Some otalgia, left greater than right. Ears feel full, leading to sensation of muffled hearing. Has tried OTC cold preps without success. No GI complaints. Appetite decreased. No recent antibiotics, recent travels, or sick contacts. No leg trauma, sedentary periods, h/o cancer, or tobacco use. No flu vaccine this year.   PMH: Past Medical History  Diagnosis Date  . Anemia   . Thalassemia     Home Meds: Prior to Admission medications   Not on File    Allergies: No Known Allergies  History   Social History  . Marital Status: Single    Spouse Name: N/A    Number of Children: N/A  . Years of Education: N/A   Occupational History  . student    Social History Main Topics  . Smoking status: Current Every Day Smoker    Types: Cigarettes  . Smokeless tobacco: Not on file  . Alcohol Use: Yes     Comment: "sometimes"  . Drug Use: No  . Sexual Activity: Not on file   Other Topics Concern  . Not on file   Social History Narrative   From [place].  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      Review of Systems  Constitutional: Positive for fever, chills, appetite change and fatigue.  HENT: Positive for congestion, ear pain, hearing loss, postnasal drip, rhinorrhea, sinus pressure, sneezing and sore throat.        Nasal congestion.  Itchy throat.  Muffled hearing left more than right.   Respiratory: Positive for cough. Negative for shortness of breath and wheezing.        Cough is  dry. Cough is worse at nighttime.   Gastrointestinal: Negative for nausea, vomiting and diarrhea.  Neurological: Positive for headaches.       Sinus headache.        Objective:   Physical Exam  Physical Exam: Blood pressure 100/60, pulse 99, temperature 98.1 F (36.7 C), temperature source Oral, resp. rate 16, height 6' (1.829 m), weight 202 lb (91.627 kg), SpO2 99.00%., Body mass index is 27.39 kg/(m^2). General: Well developed, well nourished, in no acute distress.  Head: Normocephalic, atraumatic, eyes without discharge, sclera non-icteric, nares are congested. Bilateral auditory canals clear, TM's are without perforation, pearly grey with reflective cone of light bilaterally. Frontal sinus TTP. Oral cavity moist, dentition normal. Posterior pharynx with post nasal drip and mild erythema. No peritonsillar abscess or tonsillar exudate. Uvula midline.  Neck: Supple. No thyromegaly. Full ROM. No lymphadenopathy. No nuchal rigidity.  Lungs: Clear bilaterally to auscultation without wheezes, rales, or rhonchi. Breathing is unlabored.  Heart: RRR with S1 S2. No murmurs, rubs, or gallops appreciated. Msk:  Strength and tone normal for age. Extremities: No clubbing or cyanosis. No edema. Neuro: Alert and oriented X 3. Moves all extremities spontaneously. CNII-XII grossly in tact. Psych:  Responds to questions appropriately with a normal affect.   Labs: Results for orders placed in visit on 01/23/14  POCT INFLUENZA  A/B      Result Value Ref Range   Influenza A, POC Negative     Influenza B, POC Negative         Assessment & Plan:  38 year old male with sinusitis and cough -Augmentin 875/125 mg 1 po bid #20 no RF -Atrovent NS 0.06% 2 sprays each nare bid prn #1 no RF -Tussionex 1 tsp po q 12 hours prn cough #90 mL no RF -Rest/fluids -RTC precautions   Eula Listen, MHS, PA-C [facility] [address] [place], [place] [address] [phone] [facility] 01/23/2014 3:15 PM

## 2014-01-30 ENCOUNTER — Ambulatory Visit (INDEPENDENT_AMBULATORY_CARE_PROVIDER_SITE_OTHER): Payer: Managed Care, Other (non HMO) | Admitting: Emergency Medicine

## 2014-01-30 ENCOUNTER — Ambulatory Visit: Payer: Managed Care, Other (non HMO)

## 2014-01-30 VITALS — BP 120/72 | HR 90 | Temp 97.0°F | Resp 16 | Ht 71.5 in | Wt 202.0 lb

## 2014-01-30 DIAGNOSIS — J329 Chronic sinusitis, unspecified: Secondary | ICD-10-CM

## 2014-01-30 DIAGNOSIS — R51 Headache: Secondary | ICD-10-CM

## 2014-01-30 LAB — POCT CBC
Granulocyte percent: 65.9 %G (ref 37–80)
HCT, POC: 38.2 % — AB (ref 43.5–53.7)
Hemoglobin: 12.1 g/dL — AB (ref 14.1–18.1)
LYMPH, POC: 4.2 — AB (ref 0.6–3.4)
MCH: 19.5 pg — AB (ref 27–31.2)
MCHC: 31.7 g/dL — AB (ref 31.8–35.4)
MCV: 61.4 fL — AB (ref 80–97)
MID (CBC): 0.5 (ref 0–0.9)
PLATELET COUNT, POC: 346 10*3/uL (ref 142–424)
POC Granulocyte: 9.2 — AB (ref 2–6.9)
POC LYMPH PERCENT: 30.2 %L (ref 10–50)
POC MID %: 3.9 %M (ref 0–12)
RBC: 6.22 M/uL — AB (ref 4.69–6.13)
RDW, POC: 17 %
WBC: 13.9 10*3/uL — AB (ref 4.6–10.2)

## 2014-01-30 MED ORDER — LEVOFLOXACIN 500 MG PO TABS: 500.0000 mg | ORAL_TABLET | Freq: Every day | ORAL | Status: DC

## 2014-01-30 MED ORDER — AZELASTINE HCL 0.1 % NA SOLN: 2.0000 | Freq: Two times a day (BID) | NASAL | Status: DC

## 2014-01-30 NOTE — Patient Instructions (Addendum)
Stop the Augmentin you are on. Start Levaquin 500 one a day along with azelastine 1-2 sprays each nostril twice a daySinusitis Sinusitis is redness, soreness, and swelling (inflammation) of the paranasal sinuses. Paranasal sinuses are air pockets within the bones of your face (beneath the eyes, the middle of the forehead, or above the eyes). In healthy paranasal sinuses, mucus is able to drain out, and air is able to circulate through them by way of your nose. However, when your paranasal sinuses are inflamed, mucus and air can become trapped. This can allow bacteria and other germs to grow and cause infection. Sinusitis can develop quickly and last only a short time (acute) or continue over a long period (chronic). Sinusitis that lasts for more than 12 weeks is considered chronic.  CAUSES  Causes of sinusitis include:  Allergies.  Structural abnormalities, such as displacement of the cartilage that separates your nostrils (deviated septum), which can decrease the air flow through your nose and sinuses and affect sinus drainage.  Functional abnormalities, such as when the small hairs (cilia) that line your sinuses and help remove mucus do not work properly or are not present. SYMPTOMS  Symptoms of acute and chronic sinusitis are the same. The primary symptoms are pain and pressure around the affected sinuses. Other symptoms include:  Upper toothache.  Earache.  Headache.  Bad breath.  Decreased sense of smell and taste.  A cough, which worsens when you are lying flat.  Fatigue.  Fever.  Thick drainage from your nose, which often is green and may contain pus (purulent).  Swelling and warmth over the affected sinuses. DIAGNOSIS  Your caregiver will perform a physical exam. During the exam, your caregiver may:  Look in your nose for signs of abnormal growths in your nostrils (nasal polyps).  Tap over the affected sinus to check for signs of infection.  View the inside of your  sinuses (endoscopy) with a special imaging device with a light attached (endoscope), which is inserted into your sinuses. If your caregiver suspects that you have chronic sinusitis, one or more of the following tests may be recommended:  Allergy tests.  Nasal culture A sample of mucus is taken from your nose and sent to a lab and screened for bacteria.  Nasal cytology A sample of mucus is taken from your nose and examined by your caregiver to determine if your sinusitis is related to an allergy. TREATMENT  Most cases of acute sinusitis are related to a viral infection and will resolve on their own within 10 days. Sometimes medicines are prescribed to help relieve symptoms (pain medicine, decongestants, nasal steroid sprays, or saline sprays).  However, for sinusitis related to a bacterial infection, your caregiver will prescribe antibiotic medicines. These are medicines that will help kill the bacteria causing the infection.  Rarely, sinusitis is caused by a fungal infection. In theses cases, your caregiver will prescribe antifungal medicine. For some cases of chronic sinusitis, surgery is needed. Generally, these are cases in which sinusitis recurs more than 3 times per year, despite other treatments. HOME CARE INSTRUCTIONS   Drink plenty of water. Water helps thin the mucus so your sinuses can drain more easily.  Use a humidifier.  Inhale steam 3 to 4 times a day (for example, sit in the bathroom with the shower running).  Apply a warm, moist washcloth to your face 3 to 4 times a day, or as directed by your caregiver.  Use saline nasal sprays to help moisten and clean your  sinuses.  Take over-the-counter or prescription medicines for pain, discomfort, or fever only as directed by your caregiver. SEEK IMMEDIATE MEDICAL CARE IF:  You have increasing pain or severe headaches.  You have nausea, vomiting, or drowsiness.  You have swelling around your face.  You have vision  problems.  You have a stiff neck.  You have difficulty breathing. MAKE SURE YOU:   Understand these instructions.  Will watch your condition.  Will get help right away if you are not doing well or get worse. Document Released: 11/15/2005 Document Revised: 02/07/2012 Document Reviewed: 11/30/2011 [facility] Patient Information 2014 [place], [place].

## 2014-01-30 NOTE — Progress Notes (Signed)
   Subjective:    Patient ID: Jonathan Bautista, male    DOB: 10/26/1976, 38 y.o.   MRN: [id number]  HPI Jonathan Bautista presents to clinic today with headache. He states he has sinus pressure and pain that extends down into his shoulder. He states he has green mucous when he blows his nose. He is currently taking an antibiotic. He denies pain or pressure in his chest.     Review of Systems     Objective:   Physical Exam the TMs are clear. The nose is significantly congested. Posterior pharynx is slightly red. Chest was clear to both auscultation and percussion. Patient does not appear toxic. There is tenderness over the maxillary and frontal area to Results for orders placed in visit on 01/30/14  POCT CBC      Result Value Ref Range   WBC 13.9 (*) 4.6 - 10.2 K/uL   Lymph, poc 4.2 (*) 0.6 - 3.4   POC LYMPH PERCENT 30.2  10 - 50 %L   MID (cbc) 0.5  0 - 0.9   POC MID % 3.9  0 - 12 %M   POC Granulocyte 9.2 (*) 2 - 6.9   Granulocyte percent 65.9  37 - 80 %G   RBC 6.22 (*) 4.69 - 6.13 M/uL   Hemoglobin 12.1 (*) 14.1 - 18.1 g/dL   HCT, POC 40.938.2 (*) 81.143.5 - 53.7 %   MCV 61.4 (*) 80 - 97 fL   MCH, POC 19.5 (*) 27 - 31.2 pg   MCHC 31.7 (*) 31.8 - 35.4 g/dL   RDW, POC 91.417.0     Platelet Count, POC 346  142 - 424 K/uL   MPV    0 - 99.8 fL   [facility] reading (PRIMARY) by  Dr. Cleta Albertsaub there is a significant left maxillary sinusitis as well as a suspected air fluid level in the frontal sinus.        Assessment & Plan:  Patient on Augmentin and continues to do poorly. Would change to azelastin   nasal spray along with Levaquin one a day

## 2014-02-09 ENCOUNTER — Ambulatory Visit: Payer: Managed Care, Other (non HMO)

## 2014-02-09 ENCOUNTER — Ambulatory Visit (INDEPENDENT_AMBULATORY_CARE_PROVIDER_SITE_OTHER): Payer: Managed Care, Other (non HMO) | Admitting: Family Medicine

## 2014-02-09 VITALS — BP 132/88 | HR 71 | Temp 98.4°F | Resp 16 | Ht 71.0 in | Wt 204.8 lb

## 2014-02-09 DIAGNOSIS — R062 Wheezing: Secondary | ICD-10-CM

## 2014-02-09 DIAGNOSIS — J0100 Acute maxillary sinusitis, unspecified: Secondary | ICD-10-CM

## 2014-02-09 DIAGNOSIS — H6980 Other specified disorders of Eustachian tube, unspecified ear: Secondary | ICD-10-CM

## 2014-02-09 MED ORDER — PREDNISONE 20 MG PO TABS: ORAL_TABLET | ORAL | Status: DC

## 2014-02-09 MED ORDER — LEVOFLOXACIN 500 MG PO TABS: 500.0000 mg | ORAL_TABLET | Freq: Every day | ORAL | Status: AC

## 2014-02-09 NOTE — Progress Notes (Signed)
Subjective: Patient was treated twice already for a left maxillary sinusitis. He continues to have some symptoms. He has some frontal headache. He has congestion still. He is not coughing or wheezing. He does complain of both ears popping. No fever.  Tolerated this well so far  Objective: TMs normal. Mild tenderness of maxillary sinuses. Throat clear. Neck supple without nodes. Chest clear except for a slight wheeze in the right lower lung field. Patient is not aware of it.  [facility] reading (PRIMARY) by  Dr. Alwyn RenHopper Sinusitis significantly improved. May still be a little inflammation and fluid at the base of the sinuses.  Assessment: Sinusitis, slowly improving Eustachian tube dysfunction Wheeze  Plan: . See discharge instructions. Will treat for one more week and will give some steroids. Return when necessary

## 2014-02-09 NOTE — Patient Instructions (Addendum)
Take prednisone 3 daily for 2 days,Then 2 daily for 2 days, then one daily for 2 days. This should help the sinuses in the ears and lungs.  After you finish the prednisone you can resume using the nose spray  Take the antibiotic one daily for one more week

## 2014-02-15 ENCOUNTER — Ambulatory Visit (INDEPENDENT_AMBULATORY_CARE_PROVIDER_SITE_OTHER): Payer: Managed Care, Other (non HMO) | Admitting: Family Medicine

## 2014-02-15 VITALS — BP 144/76 | HR 98 | Temp 98.2°F | Resp 18 | Ht 71.0 in | Wt 202.0 lb

## 2014-02-15 DIAGNOSIS — R51 Headache: Secondary | ICD-10-CM

## 2014-02-15 DIAGNOSIS — J3489 Other specified disorders of nose and nasal sinuses: Secondary | ICD-10-CM

## 2014-02-15 MED ORDER — BUTALBITAL-APAP-CAFFEINE 50-325-40 MG PO TABS: 1.0000 | ORAL_TABLET | Freq: Four times a day (QID) | ORAL | Status: DC | PRN

## 2014-02-15 MED ORDER — FLUTICASONE PROPIONATE 50 MCG/ACT NA SUSP: 2.0000 | Freq: Every day | NASAL | Status: DC

## 2014-02-15 NOTE — Progress Notes (Signed)
Subjective: Patient continues to have headache pain and pressure. He hurt today just in the front of his head. Yesterday he took a sinus relief medication last night and slept well. He is not blowing anything out of his nose now. He is ears have a little bit of popping in them again.  Objective: TMs are normal. Sinuses nontender. Throat clear. Neck supple without nodes. Chest clear. No wheezing any longer  Assessment: Sinus pressure Headache Eustachian tube dysfunction  Plan: Fluticasone nose spray Fioricet Return if not improving. If he keeps having headaches may need to get him stand, but I think it's probably a combination of having had a respiratory infection and the stress of school.

## 2014-02-15 NOTE — Patient Instructions (Signed)
Take Fioricet 1 or 2 tablets if needed for headache. Advised taking at the onset of the headache. Can repeat in 6 hours if needed.  Use the fluticasone nose spray 1 or 2 sprays in each nostril at bedtime to keep sinuses open  If headaches and pressure continue to persist please return. Further testing might be needed.

## 2014-08-04 ENCOUNTER — Ambulatory Visit (INDEPENDENT_AMBULATORY_CARE_PROVIDER_SITE_OTHER): Payer: Managed Care, Other (non HMO)

## 2014-08-04 ENCOUNTER — Ambulatory Visit (INDEPENDENT_AMBULATORY_CARE_PROVIDER_SITE_OTHER): Payer: Managed Care, Other (non HMO) | Admitting: Internal Medicine

## 2014-08-04 VITALS — BP 130/78 | HR 86 | Temp 98.5°F | Resp 15 | Ht 71.0 in | Wt 204.8 lb

## 2014-08-04 DIAGNOSIS — F17200 Nicotine dependence, unspecified, uncomplicated: Secondary | ICD-10-CM

## 2014-08-04 DIAGNOSIS — M546 Pain in thoracic spine: Secondary | ICD-10-CM

## 2014-08-04 DIAGNOSIS — R0602 Shortness of breath: Secondary | ICD-10-CM

## 2014-08-04 MED ORDER — CYCLOBENZAPRINE HCL 10 MG PO TABS: 10.0000 mg | ORAL_TABLET | Freq: Every day | ORAL | Status: DC

## 2014-08-04 MED ORDER — MELOXICAM 15 MG PO TABS: 15.0000 mg | ORAL_TABLET | Freq: Every day | ORAL | Status: DC

## 2014-08-04 NOTE — Progress Notes (Signed)
   Subjective:    Patient ID: Jonathan Bautista, male    DOB: 1976/08/18, 38 y.o.   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 [place] smoker Review of Systems No fever chills or night sweats No weight loss No palpitations No known lifting injuries    Objective:   Physical Exam BP 130/78  Pulse 86  Temp(Src) 98.5 F (36.9 C) (Oral)  Resp 15  Ht  (1.803 m)  Wt 204 lb 12.8 oz (92.897 kg)  BMI 28.58 kg/m2  SpO2 100% HEENT clear without nodes or thyromegaly Neck range of motion with extention produces pain in the right periscapular area Tender in the right periscapular area in the right posterior cervical area to deep palpation Chest is clear to auscultation Heart regular without murmur No sensory or motor changes in the upper extremities      [facility] reading (PRIMARY) by  Dr. Merla Riches normal chest x-ray   Assessment & Plan:  Thoracic chest pain-related to posture for long hours of studies  Exercises described Meloxicam and Flexeril for 10 days Posture changes needed Discussed smoking cessation

## 2014-09-13 ENCOUNTER — Ambulatory Visit (INDEPENDENT_AMBULATORY_CARE_PROVIDER_SITE_OTHER): Payer: Managed Care, Other (non HMO) | Admitting: Emergency Medicine

## 2014-09-13 ENCOUNTER — Ambulatory Visit (INDEPENDENT_AMBULATORY_CARE_PROVIDER_SITE_OTHER): Payer: Managed Care, Other (non HMO)

## 2014-09-13 VITALS — BP 128/84 | HR 80 | Temp 98.2°F | Resp 18 | Ht 71.5 in | Wt 207.0 lb

## 2014-09-13 DIAGNOSIS — M542 Cervicalgia: Secondary | ICD-10-CM

## 2014-09-13 DIAGNOSIS — R200 Anesthesia of skin: Secondary | ICD-10-CM

## 2014-09-13 NOTE — Progress Notes (Signed)
   Subjective:    Patient ID: Jonathan Bautista, male    DOB: 01/07/1976, 38 y.o.   MRN: [id number] This chart was scribed for Lesle ChrisSteven Domingos Riggi, MD by Jolene Provostobert Halas, Medical Scribe. This patient was seen in Room 11 and the patient's care was started at 3:13 PM.  HPI HPI Comments: Jonathan Ambleli Yackel is a 38 y.o. male who [facility] of left-sided neck pain that started one week ago. Pt states that he has right arm and hand numbness as associated symptoms. Pt states [facility] two weeks [facility] for back pain, and was prescribed medication which relieved his symptoms. Pt stated that one week after his [facility] he developed neck pain. Pt stated he reported to an orthopedic doctor (Dr. Farris HasKramer) several days ago, and was told that he needed a referral from his PCP to see the orthopedic doctor. [facility] to a physical therapist.     Review of Systems     Objective:   Physical Exam  Nursing note and vitals reviewed. Constitutional: He is oriented to person, place, and time. He appears well-developed and well-nourished.  HENT:  Head: Normocephalic and atraumatic.  Eyes: Pupils are equal, round, and reactive to light.  Neck: Normal range of motion. Neck supple. No JVD present.  Cardiovascular: Normal rate and regular rhythm.   Pulmonary/Chest: Effort normal and breath sounds normal. No respiratory distress.  Musculoskeletal: Normal range of motion. He exhibits no edema.  Neurological: He is alert and oriented to person, place, and time.  Pt has subjective numbness in left arm and 4-5th fingers.  Skin: Skin is warm and dry.  Psychiatric: He has a normal mood and affect. His behavior is normal.  [facility] reading (PRIMARY) by  Dr.Sunaina Ferrando no acute disease and no disc space narrowing      Assessment & Plan:  I personally performed the services described in this documentation, which was scribed in my presence. The recorded information has been reviewed and is  accurate.[facility]. Patient would benefit from cervical traction

## 2015-02-09 IMAGING — CR DG SINUSES 1-2V
1 series · 1 of 1 positions shown · non-contrast
Comparison: January 30, 2014

CLINICAL DATA: Sinusitis

EXAM:
PARANASAL SINUSES - 1-2 VIEW

[waters]
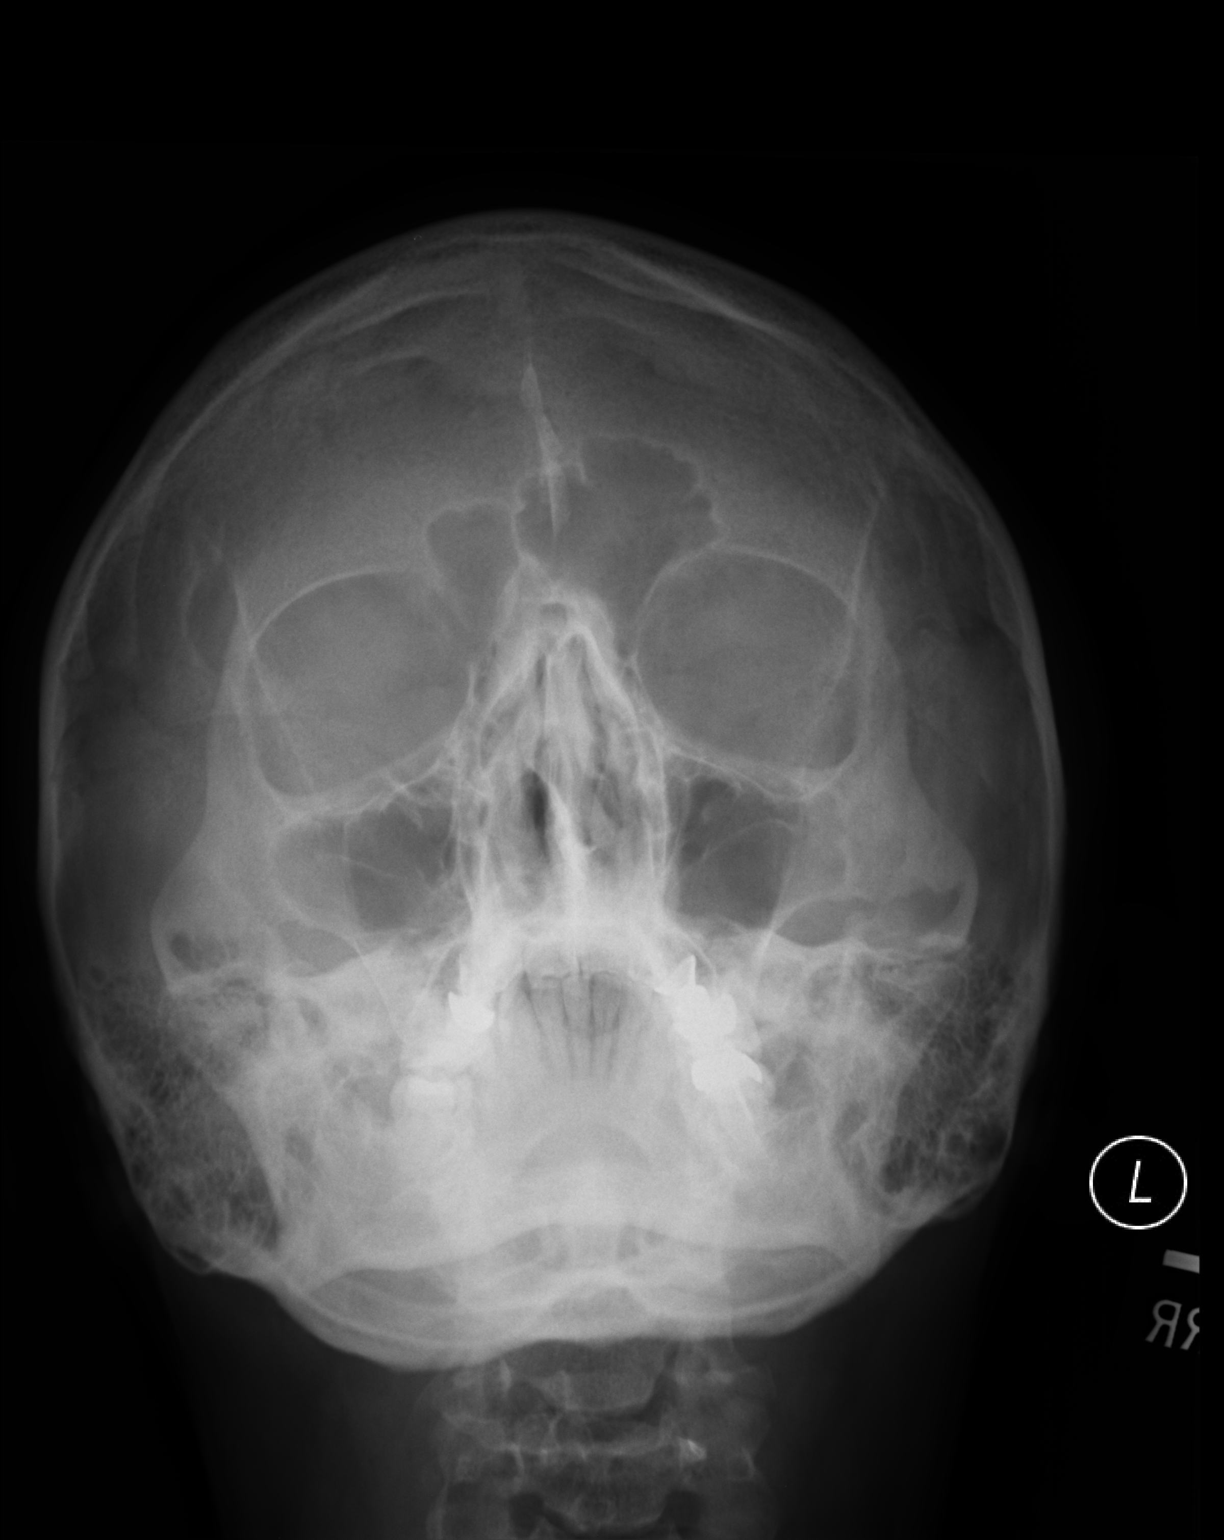

[1 of 1 positions shown; findings below may reference images not displayed]

FINDINGS: There has been clearing of disease from both maxillary antra. The
air-fluid level in the left maxillary antrum has resolved in
particular. Paranasal sinuses currently are clear. No air-fluid
level. No bony destruction or expansion. Nasal septum is midline.
IMPRESSION: Paranasal sinuses now clear.

## 2015-02-18 ENCOUNTER — Ambulatory Visit (INDEPENDENT_AMBULATORY_CARE_PROVIDER_SITE_OTHER): Payer: PPO | Admitting: Physician Assistant

## 2015-02-18 VITALS — BP 130/84 | HR 88 | Temp 98.6°F | Resp 18 | Ht 72.5 in | Wt 219.0 lb

## 2015-02-18 DIAGNOSIS — J069 Acute upper respiratory infection, unspecified: Secondary | ICD-10-CM | POA: Diagnosis not present

## 2015-02-18 DIAGNOSIS — J309 Allergic rhinitis, unspecified: Secondary | ICD-10-CM

## 2015-02-18 DIAGNOSIS — J3489 Other specified disorders of nose and nasal sinuses: Secondary | ICD-10-CM

## 2015-02-18 MED ORDER — AMOXICILLIN 875 MG PO TABS: 875.0000 mg | ORAL_TABLET | Freq: Two times a day (BID) | ORAL | Status: AC

## 2015-02-18 MED ORDER — CETIRIZINE HCL 10 MG PO TABS: 10.0000 mg | ORAL_TABLET | Freq: Every day | ORAL | Status: AC

## 2015-02-18 MED ORDER — IPRATROPIUM BROMIDE 0.03 % NA SOLN: 2.0000 | Freq: Two times a day (BID) | NASAL | Status: AC

## 2015-02-18 NOTE — Patient Instructions (Signed)
Use the nasal spray twice a day for 1-2 weeks. Use zyrtec daily for allergic symptoms. If you are not getting better in 3 days, you may fill the antibiotic. If you are getting better, do NOT fill the antibiotic. Return in 10-14 days if symptoms are not improving.

## 2015-02-18 NOTE — Progress Notes (Signed)
Subjective:    Patient ID: Jonathan Bautista, male    DOB: 12/16/1975, 39 y.o.   MRN: [id number]  HPI  This is a 39 year old male presenting with 4 days of sinus pressure and nasal congestion.  + chills but no fever. Generalized myalgias. Has sensation that ears are clicking. Denies tooth pain, sore throat, cough, fever, SOB, wheezing. Has tried dayquil and nyquil which are helping some. No sick contacts. No history of asthma. Current every day smoker - 1 ppd.  He states "I get this every year and the only thing that cures it is antibiotics" Pt is from [place] - he has been here since 2013 getting his masters and states he gets an illness every spring when the weather changes. He is planning to move back to [place] in 6 months  Review of Systems  Constitutional: Positive for chills. Negative for fever.  HENT: Positive for congestion and sinus pressure. Negative for sore throat.        Ear clicking  Eyes: Negative for redness.  Respiratory: Negative for cough.   Gastrointestinal: Negative for abdominal pain.  Musculoskeletal: Positive for myalgias.  Skin: Negative for rash.  Allergic/Immunologic: Positive for environmental allergies.   Patient Active Problem List   Diagnosis Date Noted  . Smoker 08/04/2014  . Paresthesia of bilateral legs 02/13/2013   Home meds: none  No Known Allergies  Patient's social and family history were reviewed.      Objective:   Physical Exam  Constitutional: He is oriented to person, place, and time. He appears well-developed and well-nourished. No distress.  HENT:  Head: Normocephalic and atraumatic.  Right Ear: Hearing, external ear and ear canal normal. Tympanic membrane is bulging.  Left Ear: Hearing, external ear and ear canal normal. Tympanic membrane is bulging.  Nose: Mucosal edema present.  Mouth/Throat: Uvula is midline and mucous membranes are normal. Posterior oropharyngeal erythema present. No oropharyngeal exudate or  posterior oropharyngeal edema.  TMs are mildly erythematous, no purulence Mild bilateral maxillary sinus tenderness  Eyes: Conjunctivae and lids are normal. Right eye exhibits no discharge. Left eye exhibits no discharge. No scleral icterus.  Cardiovascular: Normal rate, regular rhythm, normal heart sounds and normal pulses.   Pulmonary/Chest: Effort normal and breath sounds normal. No respiratory distress. He has no wheezes. He has no rhonchi. He has no rales.  Musculoskeletal: Normal range of motion.  Lymphadenopathy:       Head (right side): No submental, no submandibular and no tonsillar adenopathy present.       Head (left side): No submental, no submandibular and no tonsillar adenopathy present.    He has no cervical adenopathy.  Neurological: He is alert and oriented to person, place, and time.  Skin: Skin is warm, dry and intact. No lesion and no rash noted.  Psychiatric: He has a normal mood and affect. His speech is normal and behavior is normal. Thought content normal.   BP 130/84 mmHg  Pulse 88  Temp(Src) 98.6 F (37 C) (Oral)  Resp 18  Ht 6' 0.5" (1.842 m)  Wt 219 lb (99.338 kg)  BMI 29.28 kg/m2  SpO2 98%      Assessment & Plan:  1. Viral URI 2. Sinus pressure 3. Allergic rhinitis I suspect allergic contribution to current illness. Advised he use atrovent nasal spray and zyrtec daily. Pt was insistent on getting an abx. We comprised with a trial of conservative measures and if symptoms are not improving in 3 days, he may  fill prescription for amoxicillin. He will return in 2 weeks if symptoms are not improving.  - ipratropium (ATROVENT) 0.03 % nasal spray; Place 2 sprays into both nostrils 2 (two) times daily.  Dispense: 30 mL; Refill: 0 - cetirizine (ZYRTEC) 10 MG tablet; Take 1 tablet (10 mg total) by mouth daily.  Dispense: 30 tablet; Refill: 11 - amoxicillin (AMOXIL) 875 MG tablet; Take 1 tablet (875 mg total) by mouth 2 (two) times daily.  Dispense: 20 tablet;  Refill: 0   Roswell MinersNicole V. Dyke BrackettBush, PA-C, MHS [facility]  02/18/2015

## 2015-08-04 IMAGING — CR DG CHEST 2V
2 series · 2 of 2 positions shown · non-contrast
Comparison: None

CLINICAL DATA: Shortness of breath and pain in the right scapular
region.

EXAM:
CHEST - 2 VIEW

[lateral]
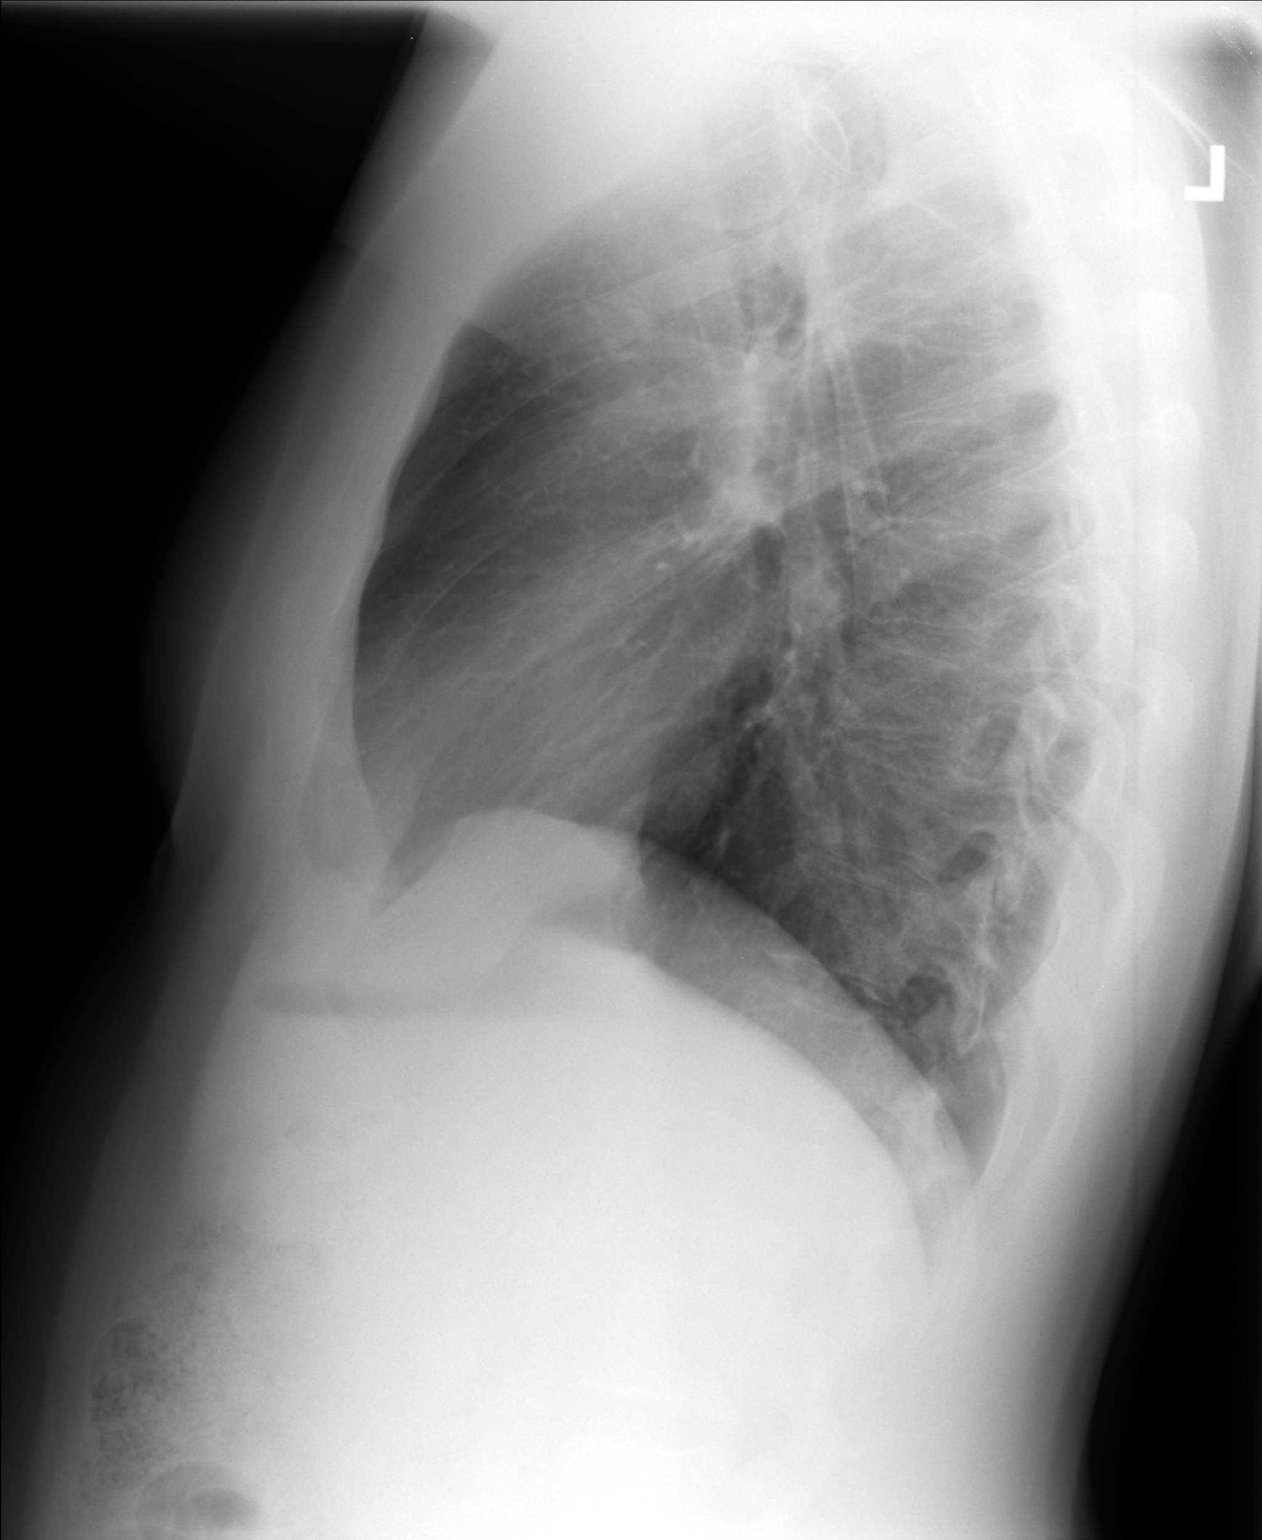

[PA]
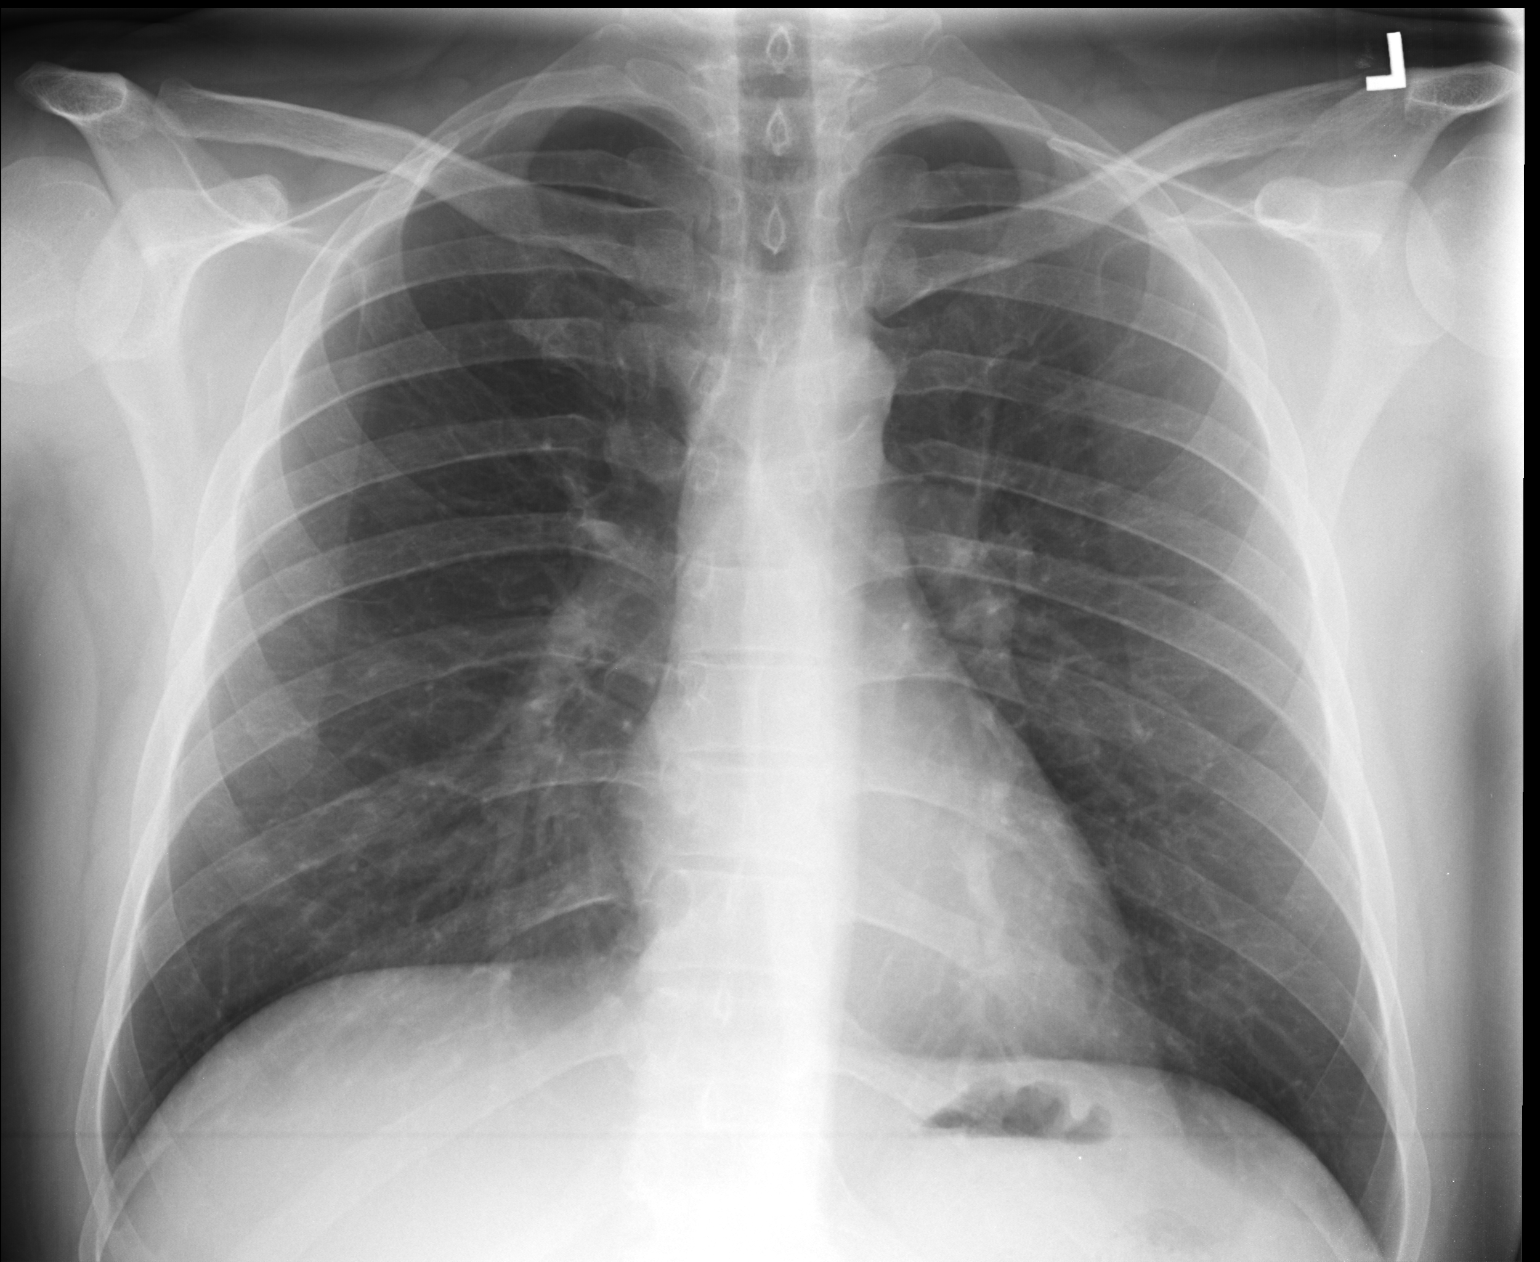

[2 of 2 positions shown; findings below may reference images not displayed]

FINDINGS: The heart size and mediastinal contours are within normal limits.
There is no evidence of pulmonary edema, consolidation,
pneumothorax, nodule or pleural fluid. The visualized skeletal
structures are unremarkable.
IMPRESSION: No active disease.
